# Patient Record
Sex: Female | Born: 2005 | Race: White | Hispanic: No | Marital: Single | Smoking: Never smoker
Health system: Southern US, Community
[De-identification: ages and names within clinical notes are randomized; demographics above are authoritative.]

---
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
## 2023-11-30 ENCOUNTER — Emergency Department (HOSPITAL_BASED_OUTPATIENT_CLINIC_OR_DEPARTMENT_OTHER): Payer: 59 | Admitting: Radiology

## 2023-11-30 ENCOUNTER — Emergency Department (HOSPITAL_BASED_OUTPATIENT_CLINIC_OR_DEPARTMENT_OTHER)
Admission: EM | Admit: 2023-11-30 | Discharge: 2023-12-01 | Discharge status: Against Medical Advice | Payer: 59 | Attending: Emergency Medicine | Admitting: Emergency Medicine

## 2023-11-30 ENCOUNTER — Encounter (HOSPITAL_BASED_OUTPATIENT_CLINIC_OR_DEPARTMENT_OTHER): Payer: Self-pay | Admitting: *Deleted

## 2023-11-30 ENCOUNTER — Other Ambulatory Visit: Payer: Self-pay

## 2023-11-30 DIAGNOSIS — R0789 Other chest pain: Secondary | ICD-10-CM | POA: Insufficient documentation

## 2023-11-30 DIAGNOSIS — Z5321 Procedure and treatment not carried out due to patient leaving prior to being seen by health care provider: Secondary | ICD-10-CM | POA: Insufficient documentation

## 2023-11-30 DIAGNOSIS — R42 Dizziness and giddiness: Secondary | ICD-10-CM | POA: Diagnosis present

## 2023-11-30 LAB — PREGNANCY, URINE: Preg Test, Ur: NEGATIVE

## 2023-11-30 NOTE — ED Notes (Signed)
No nausea, no further dizziness.

## 2023-11-30 NOTE — ED Triage Notes (Signed)
Pt was bucked off horse this afternoon and has pain in right lower ribs (from saddle horn) and on head as she struck her head, no LOC.  Felt dizzy after.  Pt reports pain with taking a deep breath.
